# Patient Record
Sex: Male | Born: 1990 | Race: White | Hispanic: No | Marital: Single | Smoking: Never smoker
Health system: Southern US, Community
[De-identification: ages and names within clinical notes are randomized; demographics above are authoritative.]

## PROBLEM LIST (undated history)

## (undated) DIAGNOSIS — S022XXA Fracture of nasal bones, initial encounter for closed fracture: Secondary | ICD-10-CM

## (undated) DIAGNOSIS — Z98811 Dental restoration status: Secondary | ICD-10-CM

## (undated) DIAGNOSIS — S0181XA Laceration without foreign body of other part of head, initial encounter: Secondary | ICD-10-CM

## (undated) HISTORY — PX: NO PAST SURGERIES: SHX2092

---

## 2017-06-12 ENCOUNTER — Emergency Department (HOSPITAL_COMMUNITY): Payer: Self-pay

## 2017-06-12 ENCOUNTER — Emergency Department (HOSPITAL_COMMUNITY)
Admission: EM | Admit: 2017-06-12 | Discharge: 2017-06-12 | Disposition: A | Discharge status: Home / Self Care | Payer: Self-pay | Attending: Emergency Medicine | Admitting: Emergency Medicine

## 2017-06-12 ENCOUNTER — Encounter (HOSPITAL_COMMUNITY): Payer: Self-pay | Admitting: Emergency Medicine

## 2017-06-12 DIAGNOSIS — Y939 Activity, unspecified: Secondary | ICD-10-CM | POA: Insufficient documentation

## 2017-06-12 DIAGNOSIS — S0990XA Unspecified injury of head, initial encounter: Secondary | ICD-10-CM

## 2017-06-12 DIAGNOSIS — F17200 Nicotine dependence, unspecified, uncomplicated: Secondary | ICD-10-CM | POA: Insufficient documentation

## 2017-06-12 DIAGNOSIS — S022XXA Fracture of nasal bones, initial encounter for closed fracture: Secondary | ICD-10-CM | POA: Insufficient documentation

## 2017-06-12 DIAGNOSIS — S0181XA Laceration without foreign body of other part of head, initial encounter: Secondary | ICD-10-CM

## 2017-06-12 DIAGNOSIS — Y9289 Other specified places as the place of occurrence of the external cause: Secondary | ICD-10-CM | POA: Insufficient documentation

## 2017-06-12 DIAGNOSIS — Z23 Encounter for immunization: Secondary | ICD-10-CM | POA: Insufficient documentation

## 2017-06-12 DIAGNOSIS — S01112A Laceration without foreign body of left eyelid and periocular area, initial encounter: Secondary | ICD-10-CM | POA: Insufficient documentation

## 2017-06-12 DIAGNOSIS — Y999 Unspecified external cause status: Secondary | ICD-10-CM | POA: Insufficient documentation

## 2017-06-12 HISTORY — DX: Laceration without foreign body of other part of head, initial encounter: S01.81XA

## 2017-06-12 HISTORY — DX: Fracture of nasal bones, initial encounter for closed fracture: S02.2XXA

## 2017-06-12 MED ORDER — CEPHALEXIN 500 MG PO CAPS: 500.0000 mg | ORAL_CAPSULE | Freq: Four times a day (QID) | ORAL | 0 refills | Status: DC

## 2017-06-12 MED ORDER — LIDOCAINE HCL (PF) 1 % IJ SOLN
5.0000 mL | Freq: Once | INTRAMUSCULAR | Status: DC
  Filled 2017-06-12: qty 5

## 2017-06-12 MED ORDER — TETANUS-DIPHTH-ACELL PERTUSSIS 5-2.5-18.5 LF-MCG/0.5 IM SUSP
0.5000 mL | Freq: Once | INTRAMUSCULAR | Status: AC
  Administered 2017-06-12: 0.5 mL via INTRAMUSCULAR
  Filled 2017-06-12: qty 0.5
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
## 2017-06-12 NOTE — ED Notes (Signed)
Patient nose is very deformed.

## 2017-06-12 NOTE — Discharge Instructions (Signed)

## 2017-06-15 ENCOUNTER — Ambulatory Visit: Payer: Self-pay | Admitting: Otolaryngology

## 2017-06-15 ENCOUNTER — Encounter (HOSPITAL_BASED_OUTPATIENT_CLINIC_OR_DEPARTMENT_OTHER): Payer: Self-pay | Admitting: *Deleted
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
## 2017-06-16 ENCOUNTER — Ambulatory Visit (HOSPITAL_BASED_OUTPATIENT_CLINIC_OR_DEPARTMENT_OTHER)
Admission: RE | Admit: 2017-06-16 | Discharge: 2017-06-16 | Disposition: A | Discharge status: Home / Self Care | Payer: Self-pay | Source: Ambulatory Visit | Attending: Otolaryngology | Admitting: Otolaryngology

## 2017-06-16 ENCOUNTER — Encounter (HOSPITAL_BASED_OUTPATIENT_CLINIC_OR_DEPARTMENT_OTHER): Payer: Self-pay

## 2017-06-16 ENCOUNTER — Encounter (HOSPITAL_BASED_OUTPATIENT_CLINIC_OR_DEPARTMENT_OTHER)
Admission: RE | Disposition: A | Discharge status: Home / Self Care | Payer: Self-pay | Source: Ambulatory Visit | Attending: Otolaryngology

## 2017-06-16 ENCOUNTER — Ambulatory Visit (HOSPITAL_BASED_OUTPATIENT_CLINIC_OR_DEPARTMENT_OTHER): Payer: Self-pay | Admitting: Anesthesiology

## 2017-06-16 DIAGNOSIS — S022XXA Fracture of nasal bones, initial encounter for closed fracture: Secondary | ICD-10-CM | POA: Insufficient documentation

## 2017-06-16 HISTORY — PX: CLOSED REDUCTION NASAL FRACTURE: SHX5365

## 2017-06-16 HISTORY — DX: Dental restoration status: Z98.811

## 2017-06-16 HISTORY — DX: Fracture of nasal bones, initial encounter for closed fracture: S02.2XXA

## 2017-06-16 HISTORY — DX: Laceration without foreign body of other part of head, initial encounter: S01.81XA

## 2017-06-16 SURGERY — CLOSED REDUCTION, FRACTURE, NASAL BONE
Anesthesia: General | Site: Nose

## 2017-06-16 MED ORDER — FENTANYL CITRATE (PF) 100 MCG/2ML IJ SOLN
INTRAMUSCULAR | Status: AC
  Filled 2017-06-16: qty 2

## 2017-06-16 MED ORDER — OXYMETAZOLINE HCL 0.05 % NA SOLN
NASAL | Status: AC
Start: 2017-06-16 — End: 2017-06-16
  Filled 2017-06-16: qty 15

## 2017-06-16 MED ORDER — LIDOCAINE HCL (CARDIAC) 20 MG/ML IV SOLN
INTRAVENOUS | Status: AC
  Filled 2017-06-16: qty 5

## 2017-06-16 MED ORDER — BACITRACIN ZINC 500 UNIT/GM EX OINT
TOPICAL_OINTMENT | CUTANEOUS | Status: DC | PRN
  Administered 2017-06-16: 1 via TOPICAL

## 2017-06-16 MED ORDER — SUCCINYLCHOLINE CHLORIDE 200 MG/10ML IV SOSY
PREFILLED_SYRINGE | INTRAVENOUS | Status: AC
  Filled 2017-06-16: qty 10

## 2017-06-16 MED ORDER — CHLORHEXIDINE GLUCONATE CLOTH 2 % EX PADS: 6.0000 | MEDICATED_PAD | Freq: Once | CUTANEOUS | Status: DC

## 2017-06-16 MED ORDER — HYDROCODONE-ACETAMINOPHEN 5-325 MG PO TABS: 1.0000 | ORAL_TABLET | Freq: Four times a day (QID) | ORAL | 0 refills | Status: AC | PRN

## 2017-06-16 MED ORDER — OXYMETAZOLINE HCL 0.05 % NA SOLN
NASAL | Status: DC | PRN
  Administered 2017-06-16: 1 via TOPICAL

## 2017-06-16 MED ORDER — FENTANYL CITRATE (PF) 100 MCG/2ML IJ SOLN
25.0000 ug | INTRAMUSCULAR | Status: DC | PRN
  Administered 2017-06-16 (×2): 50 ug via INTRAVENOUS

## 2017-06-16 MED ORDER — CEFAZOLIN SODIUM-DEXTROSE 2-4 GM/100ML-% IV SOLN
2.0000 g | INTRAVENOUS | Status: AC
  Administered 2017-06-16: 2 g via INTRAVENOUS

## 2017-06-16 MED ORDER — FENTANYL CITRATE (PF) 100 MCG/2ML IJ SOLN
INTRAMUSCULAR | Status: DC | PRN
  Administered 2017-06-16: 100 ug via INTRAVENOUS
  Administered 2017-06-16: 25 ug via INTRAVENOUS

## 2017-06-16 MED ORDER — MIDAZOLAM HCL 2 MG/2ML IJ SOLN
INTRAMUSCULAR | Status: AC
  Filled 2017-06-16: qty 2

## 2017-06-16 MED ORDER — ONDANSETRON HCL 4 MG/2ML IJ SOLN
INTRAMUSCULAR | Status: DC | PRN
  Administered 2017-06-16: 4 mg via INTRAVENOUS

## 2017-06-16 MED ORDER — PROPOFOL 10 MG/ML IV BOLUS
INTRAVENOUS | Status: DC | PRN
  Administered 2017-06-16: 150 mg via INTRAVENOUS

## 2017-06-16 MED ORDER — MIDAZOLAM HCL 5 MG/5ML IJ SOLN
INTRAMUSCULAR | Status: DC | PRN
  Administered 2017-06-16: 2 mg via INTRAVENOUS

## 2017-06-16 MED ORDER — ONDANSETRON HCL 4 MG/2ML IJ SOLN
INTRAMUSCULAR | Status: AC
  Filled 2017-06-16: qty 2

## 2017-06-16 MED ORDER — SCOPOLAMINE 1 MG/3DAYS TD PT72: 1.0000 | MEDICATED_PATCH | Freq: Once | TRANSDERMAL | Status: DC | PRN

## 2017-06-16 MED ORDER — PROPOFOL 500 MG/50ML IV EMUL
INTRAVENOUS | Status: AC
  Filled 2017-06-16: qty 100

## 2017-06-16 MED ORDER — FENTANYL CITRATE (PF) 100 MCG/2ML IJ SOLN: 50.0000 ug | INTRAMUSCULAR | Status: DC | PRN

## 2017-06-16 MED ORDER — LACTATED RINGERS IV SOLN
INTRAVENOUS | Status: DC
  Administered 2017-06-16 (×2): via INTRAVENOUS

## 2017-06-16 MED ORDER — CEPHALEXIN 500 MG PO CAPS: 500.0000 mg | ORAL_CAPSULE | Freq: Two times a day (BID) | ORAL | 0 refills | Status: AC

## 2017-06-16 MED ORDER — MEPERIDINE HCL 25 MG/ML IJ SOLN: 6.2500 mg | INTRAMUSCULAR | Status: DC | PRN

## 2017-06-16 MED ORDER — ONDANSETRON HCL 4 MG/2ML IJ SOLN: 4.0000 mg | Freq: Once | INTRAMUSCULAR | Status: DC | PRN

## 2017-06-16 MED ORDER — BACITRACIN ZINC 500 UNIT/GM EX OINT
TOPICAL_OINTMENT | CUTANEOUS | Status: AC
  Filled 2017-06-16: qty 28.35

## 2017-06-16 MED ORDER — CEFAZOLIN SODIUM-DEXTROSE 2-4 GM/100ML-% IV SOLN
INTRAVENOUS | Status: AC
Start: 2017-06-16 — End: 2017-06-16
  Filled 2017-06-16: qty 100

## 2017-06-16 MED ORDER — LIDOCAINE HCL (CARDIAC) 20 MG/ML IV SOLN
INTRAVENOUS | Status: DC | PRN
Start: 2017-06-16 — End: 2017-06-16
  Administered 2017-06-16: 30 mg via INTRAVENOUS

## 2017-06-16 MED ORDER — LIDOCAINE-EPINEPHRINE 1 %-1:100000 IJ SOLN
INTRAMUSCULAR | Status: AC
  Filled 2017-06-16: qty 1

## 2017-06-16 MED ORDER — DEXAMETHASONE SODIUM PHOSPHATE 4 MG/ML IJ SOLN
INTRAMUSCULAR | Status: DC | PRN
  Administered 2017-06-16: 10 mg via INTRAVENOUS

## 2017-06-16 MED ORDER — MIDAZOLAM HCL 2 MG/2ML IJ SOLN: 1.0000 mg | INTRAMUSCULAR | Status: DC | PRN

## 2017-06-16 MED ORDER — DEXAMETHASONE SODIUM PHOSPHATE 10 MG/ML IJ SOLN
INTRAMUSCULAR | Status: AC
  Filled 2017-06-16: qty 1

## 2017-06-16 SURGICAL SUPPLY — 23 items
BENZOIN TINCTURE PRP APPL 2/3 (GAUZE/BANDAGES/DRESSINGS) ×3 IMPLANT
CANISTER SUCT 1200ML W/VALVE (MISCELLANEOUS) ×3 IMPLANT
CLOSURE WOUND 1/2 X4 (GAUZE/BANDAGES/DRESSINGS) ×1
CONT SPEC 4OZ CLIKSEAL STRL BL (MISCELLANEOUS) IMPLANT
DEPRESSOR TONGUE BLADE STERILE (MISCELLANEOUS) IMPLANT
DRESSING ADAPTIC 1/2  N-ADH (PACKING) ×3 IMPLANT
DRSG TELFA 3X8 NADH (GAUZE/BANDAGES/DRESSINGS) ×3 IMPLANT
GAUZE SPONGE 4X4 12PLY STRL LF (GAUZE/BANDAGES/DRESSINGS) ×3 IMPLANT
GAUZE VASELINE FOILPK 1/2 X 72 (GAUZE/BANDAGES/DRESSINGS) IMPLANT
GLOVE SS BIOGEL STRL SZ 7.5 (GLOVE) ×1 IMPLANT
GLOVE SUPERSENSE BIOGEL SZ 7.5 (GLOVE) ×2
MARKER SKIN DUAL TIP RULER LAB (MISCELLANEOUS) IMPLANT
NEEDLE PRECISIONGLIDE 27X1.5 (NEEDLE) IMPLANT
PATTIES SURGICAL .5 X3 (DISPOSABLE) ×3 IMPLANT
SHEET MEDIUM DRAPE 40X70 STRL (DRAPES) ×3 IMPLANT
SPLINT NASAL THERMO PLAST (MISCELLANEOUS) ×3 IMPLANT
STRIP CLOSURE SKIN 1/2X4 (GAUZE/BANDAGES/DRESSINGS) ×2 IMPLANT
SUT SILK 2 0 FS (SUTURE) IMPLANT
SYR CONTROL 10ML LL (SYRINGE) IMPLANT
TOWEL OR 17X24 6PK STRL BLUE (TOWEL DISPOSABLE) ×3 IMPLANT
TUBE CONNECTING 20'X1/4 (TUBING) ×1
TUBE CONNECTING 20X1/4 (TUBING) ×2 IMPLANT
YANKAUER SUCT BULB TIP NO VENT (SUCTIONS) IMPLANT

## 2017-06-16 NOTE — Transfer of Care (Signed)
Immediate Anesthesia Transfer of Care Note  Patient: Douglas GimenezEric Therrien  Procedure(s) Performed: Procedure(s): CLOSED REDUCTION NASAL FRACTURE (N/A)  Patient Location: PACU  Anesthesia Type:General  Level of Consciousness: sedated  Airway & Oxygen Therapy: Patient Spontanous Breathing and Patient connected to face mask oxygen  Post-op Assessment: Report given to RN and Post -op Vital signs reviewed and stable  Post vital signs: Reviewed and stable  Last Vitals:  Vitals:   06/16/17 0708  BP: 110/76  Pulse: 61  Resp: 20  Temp: 36.5 C    Last Pain:  Vitals:   06/16/17 0708  TempSrc: Oral         Complications: No apparent anesthesia complications

## 2017-06-16 NOTE — Interval H&P Note (Signed)
History and Physical Interval Note:  06/16/2017 8:14 AM  Melida GimenezEric Boydstun  has presented today for surgery, with the diagnosis of NASAL FRACTURE  The various methods of treatment have been discussed with the patient and family. After consideration of risks, benefits and other options for treatment, the patient has consented to  Procedure(s): CLOSED REDUCTION NASAL FRACTURE (N/A) as a surgical intervention .  The patient's history has been reviewed, patient examined, no change in status, stable for surgery.  I have reviewed the patient's chart and labs.  Questions were answered to the patient's satisfaction.     Espyn Radwan

## 2017-06-16 NOTE — Anesthesia Postprocedure Evaluation (Signed)
Anesthesia Post Note  Patient: Douglas GimenezEric Moss  Procedure(s) Performed: Procedure(s) (LRB): CLOSED REDUCTION NASAL FRACTURE (N/A)     Patient location during evaluation: PACU Anesthesia Type: General Level of consciousness: awake and alert Pain management: pain level controlled Vital Signs Assessment: post-procedure vital signs reviewed and stable Respiratory status: spontaneous breathing, nonlabored ventilation, respiratory function stable and patient connected to nasal cannula oxygen Cardiovascular status: blood pressure returned to baseline and stable Postop Assessment: no signs of nausea or vomiting Anesthetic complications: no    Last Vitals:  Vitals:   06/16/17 0945 06/16/17 1000  BP: 136/85 (!) 126/93  Pulse: 73 67  Resp: 16 (!) 21  Temp:      Last Pain:  Vitals:   06/16/17 1000  TempSrc:   PainSc: 4                  Douglas Moss

## 2017-06-16 NOTE — Brief Op Note (Signed)
06/16/2017  9:00 AM  PATIENT:  Douglas GimenezEric Moss  26 y.o. male  PRE-OPERATIVE DIAGNOSIS:  NASAL FRACTURE  POST-OPERATIVE DIAGNOSIS:  NASAL FRACTURE  PROCEDURE:  Procedure(s): CLOSED REDUCTION NASAL FRACTURE (N/A)  SURGEON:  Surgeon(s) and Role:    Drema Halon* Maymie Brunke E, MD - Primary  PHYSICIAN ASSISTANT:   ASSISTANTS: none   ANESTHESIA:   general  EBL:  Total I/O In: 1500 [I.V.:1500] Out: 0   BLOOD ADMINISTERED:none  DRAINS: none   LOCAL MEDICATIONS USED:  NONE  SPECIMEN:  No Specimen  DISPOSITION OF SPECIMEN:  N/A  COUNTS:  YES  TOURNIQUET:  * No tourniquets in log *  DICTATION: .Other Dictation: Dictation Number [id number]  PLAN OF CARE: Discharge to home after PACU  PATIENT DISPOSITION:  PACU - hemodynamically stable.   Delay start of Pharmacological VTE agent (>24hrs) due to surgical blood loss or risk of bleeding: yes

## 2017-06-16 NOTE — Anesthesia Procedure Notes (Signed)
Procedure Name: LMA Insertion Date/Time: 06/16/2017 8:27 AM Performed by: Genevieve NorlanderLINKA, Deshawn Skelley L Pre-anesthesia Checklist: Patient identified, Emergency Drugs available, Suction available, Patient being monitored and Timeout performed Patient Re-evaluated:Patient Re-evaluated prior to induction Oxygen Delivery Method: Circle system utilized Preoxygenation: Pre-oxygenation with 100% oxygen Induction Type: IV induction Ventilation: Mask ventilation without difficulty LMA: LMA inserted LMA Size: 5.0 Number of attempts: 1 Airway Equipment and Method: Bite block Placement Confirmation: positive ETCO2 Tube secured with: Tape Dental Injury: Teeth and Oropharynx as per pre-operative assessment

## 2017-06-16 NOTE — Discharge Instructions (Addendum)
Start antibiotic today Keflex 500mg  two times per day x 5 days Tylenol, motrin or hydrocodone 1-2 tabs every 6 hrs prn pain Keep nasal cast dry Return to see Dr Ezzard StandingNewman at his office tomorrow at 8:30 - 9:30 to have nasal packing removed   Post Anesthesia Home Care Instructions  Activity: Get plenty of rest for the remainder of the day. A responsible individual must stay with you for 24 hours following the procedure.  For the next 24 hours, DO NOT: -Drive a car -Advertising copywriterperate machinery -Drink alcoholic beverages -Take any medication unless instructed by your physician -Make any legal decisions or sign important papers.  Meals: Start with liquid foods such as gelatin or soup. Progress to regular foods as tolerated. Avoid greasy, spicy, heavy foods. If nausea and/or vomiting occur, drink only clear liquids until the nausea and/or vomiting subsides. Call your physician if vomiting continues.  Special Instructions/Symptoms: Your throat may feel dry or sore from the anesthesia or the breathing tube placed in your throat during surgery. If this causes discomfort, gargle with warm salt water. The discomfort should disappear within 24 hours.  If you had a scopolamine patch placed behind your ear for the management of post- operative nausea and/or vomiting:  1. The medication in the patch is effective for 72 hours, after which it should be removed.  Wrap patch in a tissue and discard in the trash. Wash hands thoroughly with soap and water. 2. You may remove the patch earlier than 72 hours if you experience unpleasant side effects which may include dry mouth, dizziness or visual disturbances. 3. Avoid touching the patch. Wash your hands with soap and water after contact with the patch.
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
## 2017-06-16 NOTE — Anesthesia Preprocedure Evaluation (Signed)
Anesthesia Evaluation  Patient identified by MRN, date of birth, ID band Patient awake    Reviewed: Allergy & Precautions, NPO status , Patient's Chart, lab work & pertinent test results  Airway Mallampati: II  TM Distance: >3 FB Neck ROM: Full    Dental no notable dental hx.    Pulmonary neg pulmonary ROS,    Pulmonary exam normal breath sounds clear to auscultation       Cardiovascular negative cardio ROS Normal cardiovascular exam Rhythm:Regular Rate:Normal     Neuro/Psych negative neurological ROS  negative psych ROS   GI/Hepatic negative GI ROS, Neg liver ROS,   Endo/Other  negative endocrine ROS  Renal/GU negative Renal ROS  negative genitourinary   Musculoskeletal negative musculoskeletal ROS (+)   Abdominal   Peds negative pediatric ROS (+)  Hematology negative hematology ROS (+)   Anesthesia Other Findings   Reproductive/Obstetrics negative OB ROS                             Anesthesia Physical Anesthesia Plan  ASA: I  Anesthesia Plan: General   Post-op Pain Management:    Induction: Intravenous  PONV Risk Score and Plan: 2 and Ondansetron, Dexamethasone and Treatment may vary due to age or medical condition  Airway Management Planned: LMA  Additional Equipment:   Intra-op Plan:   Post-operative Plan:   Informed Consent:   Plan Discussed with: CRNA and Surgeon  Anesthesia Plan Comments: ( )        Anesthesia Quick Evaluation

## 2017-06-16 NOTE — Op Note (Deleted)
  The note originally documented on this encounter has been moved the the encounter in which it belongs.  

## 2017-06-17 ENCOUNTER — Encounter (HOSPITAL_BASED_OUTPATIENT_CLINIC_OR_DEPARTMENT_OTHER): Payer: Self-pay | Admitting: Otolaryngology
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
